# Patient Record
Sex: Male | Born: 1995 | Race: White | Hispanic: No | Marital: Single | Smoking: Current some day smoker
Health system: Southern US, Community
[De-identification: ages and names within clinical notes are randomized; demographics above are authoritative.]

## PROBLEM LIST (undated history)

## (undated) DIAGNOSIS — F988 Other specified behavioral and emotional disorders with onset usually occurring in childhood and adolescence: Secondary | ICD-10-CM

## (undated) DIAGNOSIS — G43009 Migraine without aura, not intractable, without status migrainosus: Secondary | ICD-10-CM

## (undated) HISTORY — DX: Other specified behavioral and emotional disorders with onset usually occurring in childhood and adolescence: F98.8

## (undated) HISTORY — DX: Migraine without aura, not intractable, without status migrainosus: G43.009

---
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
## 2016-06-18 ENCOUNTER — Encounter: Payer: Self-pay | Admitting: Neurology

## 2016-06-18 ENCOUNTER — Ambulatory Visit (INDEPENDENT_AMBULATORY_CARE_PROVIDER_SITE_OTHER): Payer: No Typology Code available for payment source | Admitting: Neurology

## 2016-06-18 DIAGNOSIS — G43009 Migraine without aura, not intractable, without status migrainosus: Secondary | ICD-10-CM | POA: Diagnosis not present

## 2016-06-18 HISTORY — DX: Migraine without aura, not intractable, without status migrainosus: G43.009

## 2016-06-18 NOTE — Progress Notes (Signed)
Reason for visit: Confusion episodes  Referring physician: Dr. Leanora IvanoffShaw  Michael Travis is a 20 y.o. male  History of present illness:  Michael Travis is a 20 year old right-handed white male with a history of episodes dating back 2 years. Michael Travis has had some recent increase in frequency. Episodes generally will start with a sensation of feeling somewhat spacey, with difficulty with concentration and cognitive processing. At times, Michael events may become so severe that he has difficulty with speech, he may have slurred speech at times. Michael Travis will develop a throbbing headache sensation in Michael temporal regions bilaterally. Michael event lasted about one hour then cleared. Michael Travis has no numbness or weakness of Michael extremities. During Michael event, Michael Travis may have difficulty with operating a motor vehicle. He has had several events where he will have nausea and vomiting with Michael event. He denies any visual disturbances. His mother also has a history of migraine with visual aura. Michael Travis generally will go several months between events, he does not have Michael episodes frequently. Michael Travis may take Tylenol for Michael headache on occasion. He comes to this office for an evaluation.  Past Medical History:  Diagnosis Date  . ADD (attention deficit disorder)   . Common migraine 06/18/2016    History reviewed. No pertinent surgical history.  Family History  Problem Relation Age of Onset  . Thyroid cancer Mother   . Ankylosing spondylitis Father   . Hypertension Father     Social history:  reports that he has been smoking Cigars.  He has never used smokeless tobacco. He reports that he drinks alcohol. He reports that he uses drugs, including Marijuana.  Medications:  Prior to Admission medications   Medication Sig Start Date End Date Taking? Authorizing Provider  BIOTIN PO Take by mouth.   Yes Historical Provider, MD  lisdexamfetamine (VYVANSE) 40 MG capsule Take 40 mg by mouth every  morning.   Yes Historical Provider, MD  methylphenidate (RITALIN) 10 MG tablet Take 10 mg by mouth 2 (two) times daily as needed.   Yes Historical Provider, MD  Multiple Vitamin (MULTIVITAMIN) tablet Take 1 tablet by mouth daily.   Yes Historical Provider, MD     No Known Allergies  ROS:  Out of a complete 14 system review of symptoms, Michael Travis complains only of Michael following symptoms, and all other reviewed systems are negative.  Confusion, headache, weakness, slurred speech  Blood pressure 110/76, pulse 76, height 6' (1.829 m), weight 162 lb 4 oz (73.6 kg).  Physical Exam  General: Michael Travis is alert and cooperative at Michael time of Michael examination.  Eyes: Pupils are equal, round, and reactive to light. Discs are flat bilaterally.  Neck: Michael neck is supple, no carotid bruits are noted.  Respiratory: Michael respiratory examination is clear.  Cardiovascular: Michael cardiovascular examination reveals a regular rate and rhythm, no obvious murmurs or rubs are noted.  Skin: Extremities are without significant edema.  Neurologic Exam  Mental status: Michael Travis is alert and oriented x 3 at Michael time of Michael examination. Michael Travis has apparent normal recent and remote memory, with an apparently normal attention span and concentration ability.  Cranial nerves: Facial symmetry is present. There is good sensation of Michael face to pinprick and soft touch bilaterally. Michael strength of Michael facial muscles and Michael muscles to head turning and shoulder shrug are normal bilaterally. Speech is well enunciated, no aphasia or dysarthria is noted. Extraocular movements are full.  Visual fields are full. Michael tongue is midline, and Michael Travis has symmetric elevation of Michael soft palate. No obvious hearing deficits are noted.  Motor: Michael motor testing reveals 5 over 5 strength of all 4 extremities. Good symmetric motor tone is noted throughout.  Sensory: Sensory testing is intact to pinprick, soft touch,  vibration sensation, and position sense on all 4 extremities. No evidence of extinction is noted.  Coordination: Cerebellar testing reveals good finger-nose-finger and heel-to-shin bilaterally.  Gait and station: Gait is normal. Tandem gait is normal. Romberg is negative. No drift is seen.  Reflexes: Deep tendon reflexes are symmetric and normal bilaterally. Toes are downgoing bilaterally.   Assessment/Plan:  1. Migraine headache  Michael Travis does not know of any particular activators for his migraine. Michael Travis is having episodes of cognitive slowing associated with some nausea and vomiting, bitemporal throbbing headache. Michael headaches are not extremely frequent, we will watch conservatively at this point. If Michael headaches become more frequent, we may require a daily prophylactic medication. Medication such as Maxalt or Imitrex are not likely to improve Michael aura events, only Michael headache itself which is not Michael disabling issue. Michael Travis will follow-up on an as-needed basis, he is to contact me if he believes that there is some change in frequency or alteration in symptom complex associated with Michael above headaches.  Marlan Palau MD 06/18/2016 9:06 AM  [facility] [address] [place], [place] [address]  Phone [phone] Fax [phone]

## 2016-06-18 NOTE — Patient Instructions (Addendum)

## 2017-09-16 ENCOUNTER — Other Ambulatory Visit: Payer: Self-pay | Admitting: Family Medicine

## 2017-09-16 ENCOUNTER — Ambulatory Visit
Admission: RE | Admit: 2017-09-16 | Discharge: 2017-09-16 | Disposition: A | Discharge status: Home / Self Care | Payer: BLUE CROSS/BLUE SHIELD | Source: Ambulatory Visit | Attending: Family Medicine | Admitting: Family Medicine

## 2017-09-16 DIAGNOSIS — M79644 Pain in right finger(s): Secondary | ICD-10-CM

## 2017-09-22 ENCOUNTER — Ambulatory Visit: Payer: No Typology Code available for payment source | Admitting: Podiatry

## 2017-09-24 ENCOUNTER — Ambulatory Visit: Payer: No Typology Code available for payment source | Admitting: Podiatry

## 2018-08-30 IMAGING — CR DG FINGER THUMB 2+V*R*
3 series · 3 of 3 positions shown · non-contrast
Comparison: None

CLINICAL DATA: RIGHT thumb pain for 3-4 months at MCP joint, no
known injury

EXAM:
RIGHT THUMB 2+V

[x finger pa right]
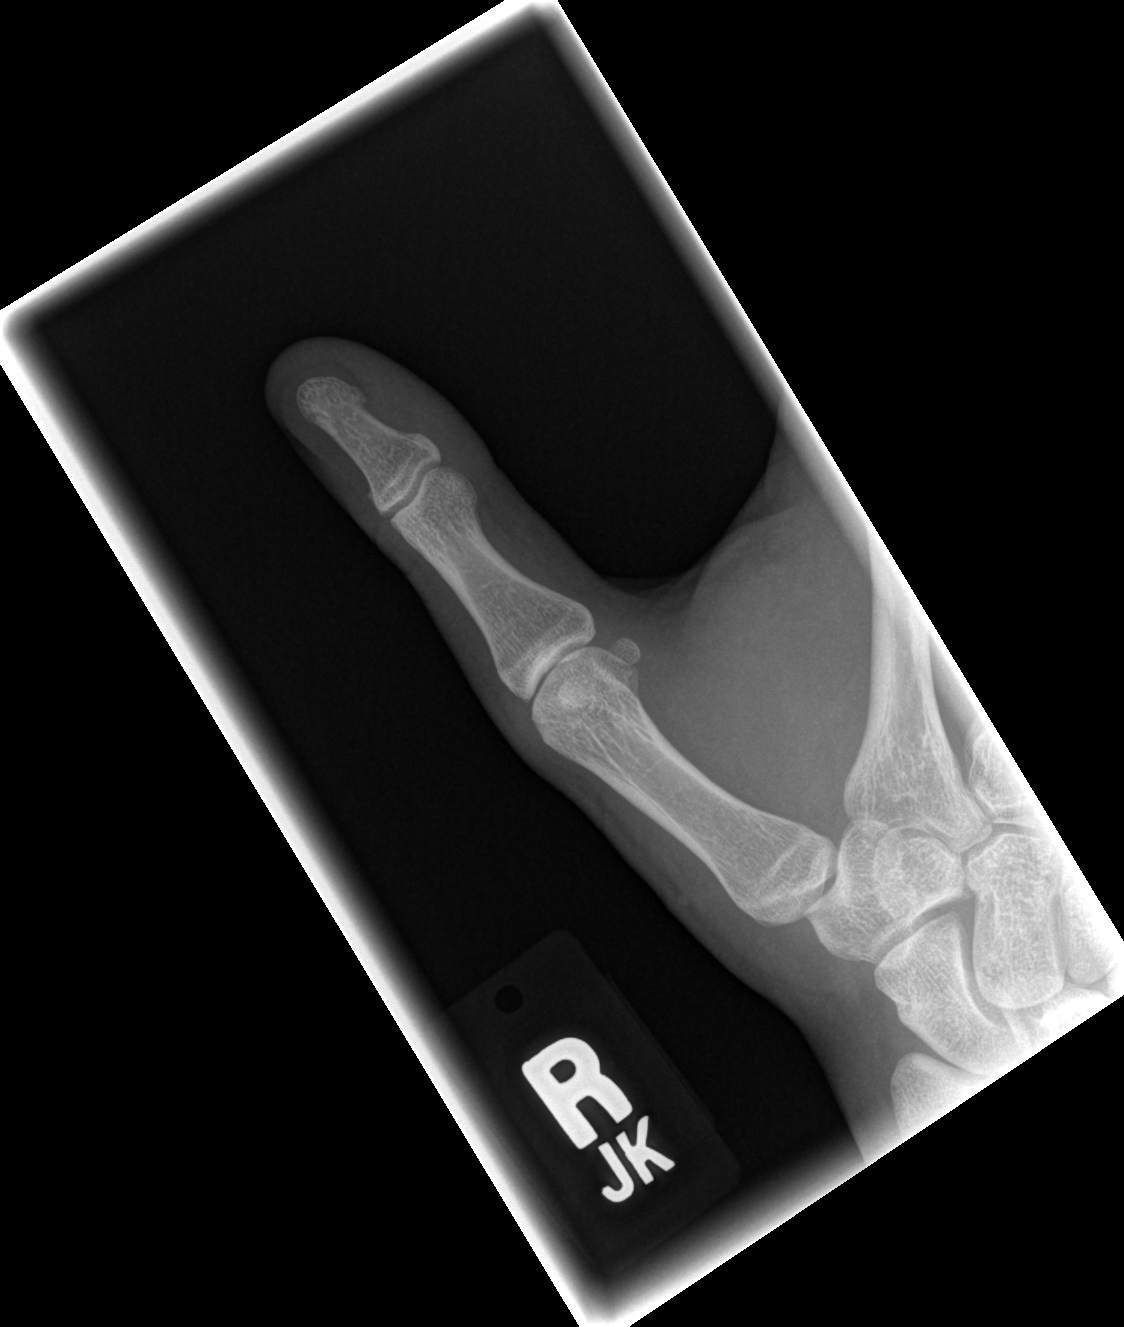

[x finger obl. right]
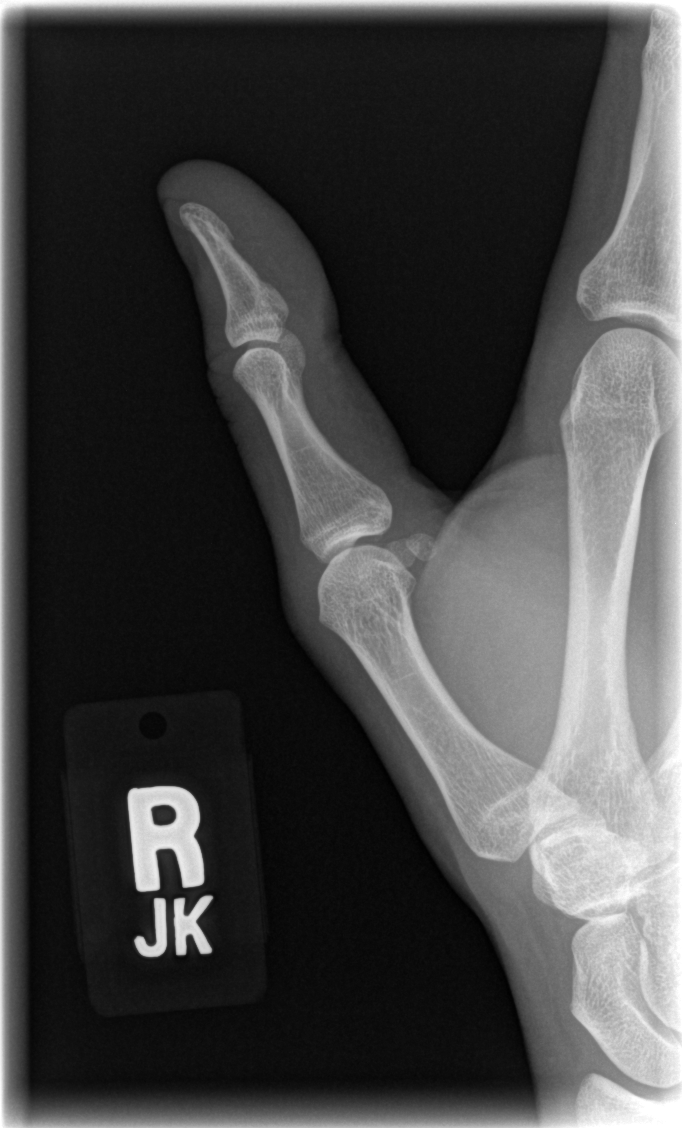

[x finger lateral right]
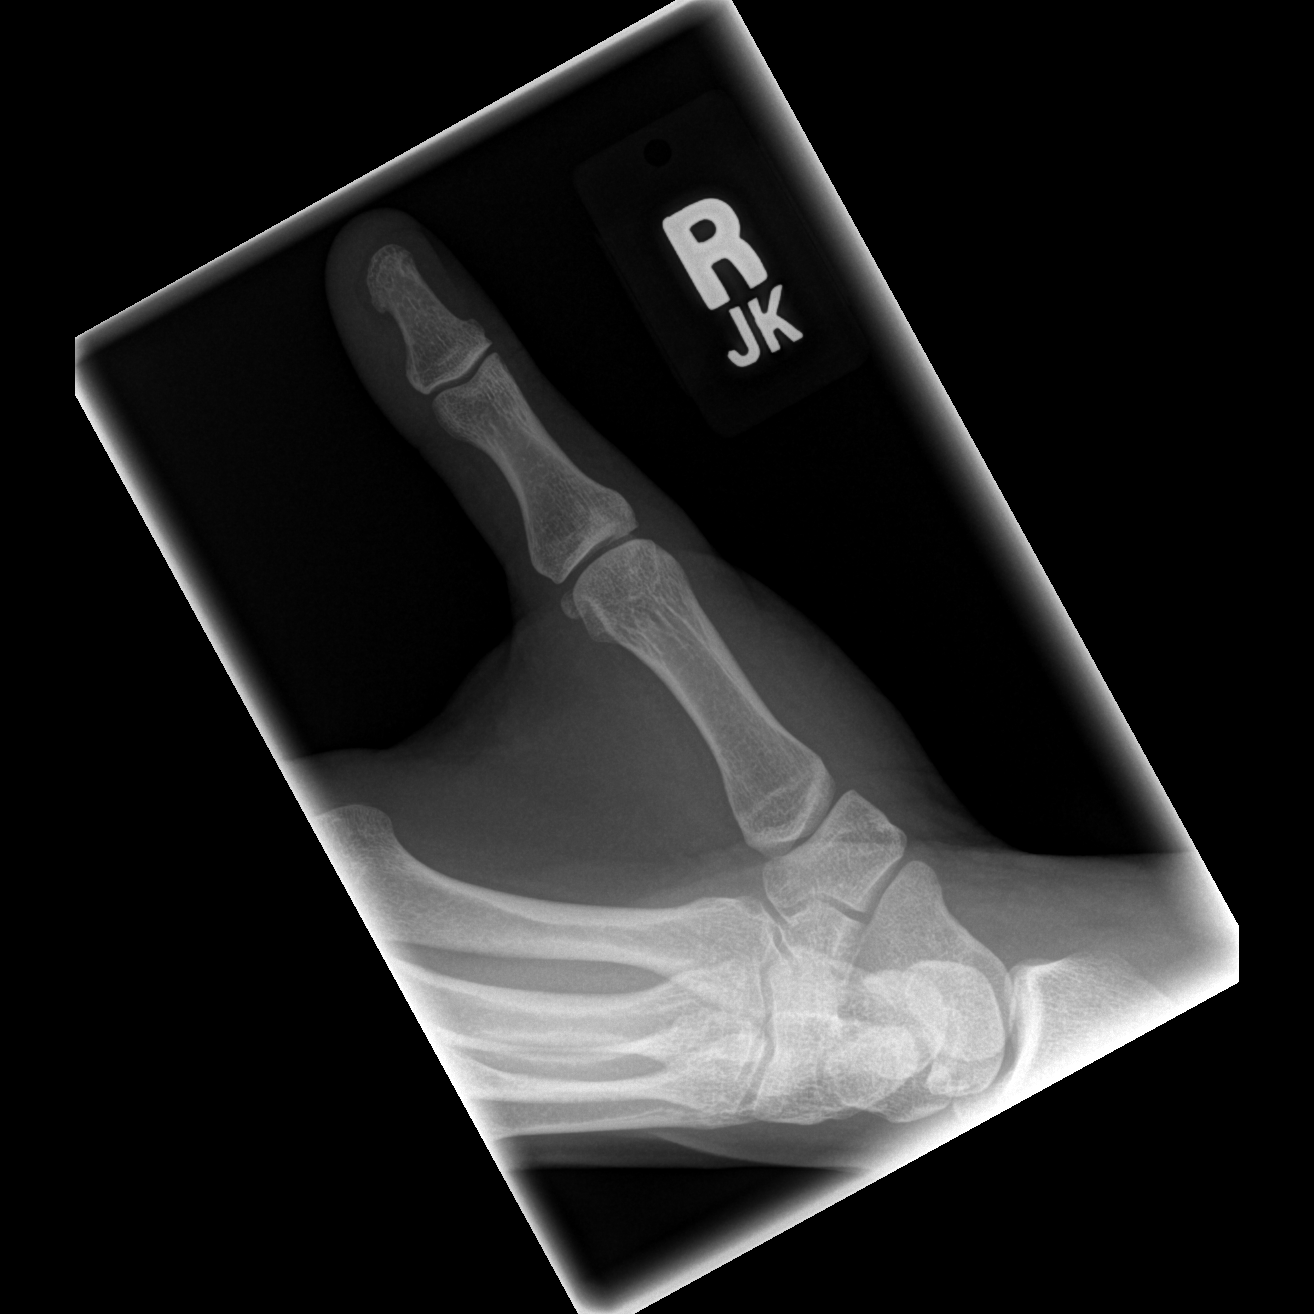

[3 of 3 positions shown; findings below may reference images not displayed]

FINDINGS: Osseous mineralization normal.

Joint spaces preserved.

No acute fracture, dislocation, or bone destruction.

No erosive or proliferative changes seen.
IMPRESSION: Normal exam.

## 2020-09-11 ENCOUNTER — Other Ambulatory Visit (HOSPITAL_COMMUNITY): Payer: Self-pay | Admitting: Family Medicine

## 2020-11-21 ENCOUNTER — Other Ambulatory Visit (HOSPITAL_COMMUNITY): Payer: Self-pay | Admitting: Family Medicine
